# Patient Record
Sex: Male | Born: 1980 | Race: Black or African American | Marital: Single
Health system: Southern US, Community
[De-identification: ages and names within clinical notes are randomized; demographics above are authoritative.]

---

## 2010-06-15 ENCOUNTER — Other Ambulatory Visit: Payer: Self-pay | Admitting: Orthopaedic Surgery

## 2010-06-15 DIAGNOSIS — M25511 Pain in right shoulder: Secondary | ICD-10-CM

## 2010-06-15 DIAGNOSIS — R531 Weakness: Secondary | ICD-10-CM

## 2010-06-19 ENCOUNTER — Other Ambulatory Visit: Payer: Self-pay

## 2010-06-23 ENCOUNTER — Other Ambulatory Visit: Payer: Self-pay

## 2010-06-23 ENCOUNTER — Other Ambulatory Visit: Payer: Self-pay | Admitting: Orthopaedic Surgery

## 2010-06-23 ENCOUNTER — Ambulatory Visit
Admission: RE | Admit: 2010-06-23 | Discharge: 2010-06-23 | Disposition: A | Discharge status: Home / Self Care | Payer: Self-pay | Source: Ambulatory Visit | Attending: Orthopaedic Surgery | Admitting: Orthopaedic Surgery

## 2010-06-23 DIAGNOSIS — R531 Weakness: Secondary | ICD-10-CM

## 2010-06-23 DIAGNOSIS — M25511 Pain in right shoulder: Secondary | ICD-10-CM

## 2010-07-06 ENCOUNTER — Ambulatory Visit: Payer: Self-pay | Attending: Orthopaedic Surgery

## 2010-07-06 DIAGNOSIS — M6281 Muscle weakness (generalized): Secondary | ICD-10-CM | POA: Insufficient documentation

## 2010-07-06 DIAGNOSIS — M25519 Pain in unspecified shoulder: Secondary | ICD-10-CM | POA: Insufficient documentation

## 2010-07-06 DIAGNOSIS — IMO0001 Reserved for inherently not codable concepts without codable children: Secondary | ICD-10-CM | POA: Insufficient documentation

## 2010-07-06 DIAGNOSIS — M25619 Stiffness of unspecified shoulder, not elsewhere classified: Secondary | ICD-10-CM | POA: Insufficient documentation

## 2010-07-14 ENCOUNTER — Ambulatory Visit: Payer: Self-pay | Attending: Orthopaedic Surgery

## 2010-07-14 DIAGNOSIS — M6281 Muscle weakness (generalized): Secondary | ICD-10-CM | POA: Insufficient documentation

## 2010-07-14 DIAGNOSIS — M25519 Pain in unspecified shoulder: Secondary | ICD-10-CM | POA: Insufficient documentation

## 2010-07-14 DIAGNOSIS — IMO0001 Reserved for inherently not codable concepts without codable children: Secondary | ICD-10-CM | POA: Insufficient documentation

## 2010-07-14 DIAGNOSIS — M25619 Stiffness of unspecified shoulder, not elsewhere classified: Secondary | ICD-10-CM | POA: Insufficient documentation

## 2010-07-21 ENCOUNTER — Ambulatory Visit: Payer: Self-pay

## 2010-07-23 ENCOUNTER — Ambulatory Visit: Payer: Self-pay

## 2010-07-28 ENCOUNTER — Ambulatory Visit: Payer: Self-pay

## 2010-07-31 ENCOUNTER — Ambulatory Visit: Payer: Self-pay

## 2010-08-07 ENCOUNTER — Ambulatory Visit: Payer: Self-pay

## 2015-02-05 ENCOUNTER — Encounter (HOSPITAL_COMMUNITY): Payer: Self-pay | Admitting: *Deleted

## 2015-02-05 ENCOUNTER — Emergency Department (HOSPITAL_COMMUNITY)
Admission: EM | Admit: 2015-02-05 | Discharge: 2015-02-05 | Disposition: A | Discharge status: Home / Self Care | Payer: BLUE CROSS/BLUE SHIELD | Attending: Emergency Medicine | Admitting: Emergency Medicine

## 2015-02-05 ENCOUNTER — Emergency Department (HOSPITAL_COMMUNITY): Payer: BLUE CROSS/BLUE SHIELD

## 2015-02-05 DIAGNOSIS — R109 Unspecified abdominal pain: Secondary | ICD-10-CM | POA: Diagnosis present

## 2015-02-05 DIAGNOSIS — N200 Calculus of kidney: Secondary | ICD-10-CM | POA: Diagnosis not present

## 2015-02-05 LAB — URINE MICROSCOPIC-ADD ON

## 2015-02-05 LAB — URINALYSIS, ROUTINE W REFLEX MICROSCOPIC
Bilirubin Urine: NEGATIVE
GLUCOSE, UA: NEGATIVE mg/dL
KETONES UR: NEGATIVE mg/dL
Nitrite: NEGATIVE
PH: 6 (ref 5.0–8.0)
Protein, ur: NEGATIVE mg/dL
Specific Gravity, Urine: 1.024 (ref 1.005–1.030)
Urobilinogen, UA: 0.2 mg/dL (ref 0.0–1.0)

## 2015-02-05 LAB — CBC
HCT: 41.3 % (ref 39.0–52.0)
Hemoglobin: 13.9 g/dL (ref 13.0–17.0)
MCH: 28.4 pg (ref 26.0–34.0)
MCHC: 33.7 g/dL (ref 30.0–36.0)
MCV: 84.5 fL (ref 78.0–100.0)
PLATELETS: 194 10*3/uL (ref 150–400)
RBC: 4.89 MIL/uL (ref 4.22–5.81)
RDW: 13.8 % (ref 11.5–15.5)
WBC: 11.4 10*3/uL — ABNORMAL HIGH (ref 4.0–10.5)

## 2015-02-05 LAB — LIPASE, BLOOD: LIPASE: 24 U/L (ref 22–51)

## 2015-02-05 LAB — COMPREHENSIVE METABOLIC PANEL
ALBUMIN: 4.5 g/dL (ref 3.5–5.0)
ALK PHOS: 88 U/L (ref 38–126)
ALT: 24 U/L (ref 17–63)
AST: 36 U/L (ref 15–41)
Anion gap: 11 (ref 5–15)
BUN: 10 mg/dL (ref 6–20)
CALCIUM: 10 mg/dL (ref 8.9–10.3)
CHLORIDE: 104 mmol/L (ref 101–111)
CO2: 23 mmol/L (ref 22–32)
CREATININE: 1.3 mg/dL — AB (ref 0.61–1.24)
GFR calc Af Amer: >60 mL/min (ref >60)
GFR calc non Af Amer: >60 mL/min (ref >60)
GLUCOSE: 132 mg/dL — AB (ref 65–99)
Potassium: 3.8 mmol/L (ref 3.5–5.1)
SODIUM: 138 mmol/L (ref 135–145)
Total Bilirubin: 2.7 mg/dL — ABNORMAL HIGH (ref 0.3–1.2)
Total Protein: 8.4 g/dL — ABNORMAL HIGH (ref 6.5–8.1)

## 2015-02-05 MED ORDER — TAMSULOSIN HCL 0.4 MG PO CAPS: 0.4000 mg | ORAL_CAPSULE | Freq: Every day | ORAL | Status: AC

## 2015-02-05 MED ORDER — IOHEXOL 300 MG/ML  SOLN
25.0000 mL | Freq: Once | INTRAMUSCULAR | Status: AC | PRN
  Administered 2015-02-05: 25 mL via ORAL

## 2015-02-05 MED ORDER — IOHEXOL 300 MG/ML  SOLN
100.0000 mL | Freq: Once | INTRAMUSCULAR | Status: AC | PRN
  Administered 2015-02-05: 100 mL via INTRAVENOUS

## 2015-02-05 MED ORDER — ONDANSETRON HCL 4 MG/2ML IJ SOLN
4.0000 mg | Freq: Once | INTRAMUSCULAR | Status: AC
  Administered 2015-02-05: 4 mg via INTRAVENOUS
  Filled 2015-02-05: qty 2

## 2015-02-05 MED ORDER — FENTANYL CITRATE (PF) 100 MCG/2ML IJ SOLN
50.0000 ug | Freq: Once | INTRAMUSCULAR | Status: AC
  Administered 2015-02-05: 50 ug via INTRAVENOUS
  Filled 2015-02-05: qty 2

## 2015-02-05 MED ORDER — SODIUM CHLORIDE 0.9 % IV BOLUS (SEPSIS)
1000.0000 mL | Freq: Once | INTRAVENOUS | Status: AC
  Administered 2015-02-05: 1000 mL via INTRAVENOUS

## 2015-02-05 MED ORDER — IBUPROFEN 600 MG PO TABS: 600.0000 mg | ORAL_TABLET | Freq: Three times a day (TID) | ORAL | Status: AC

## 2015-02-05 MED ORDER — KETOROLAC TROMETHAMINE 30 MG/ML IJ SOLN
30.0000 mg | Freq: Once | INTRAMUSCULAR | Status: AC
  Administered 2015-02-05: 30 mg via INTRAVENOUS
  Filled 2015-02-05: qty 1

## 2015-02-05 MED ORDER — HYDROCODONE-ACETAMINOPHEN 5-325 MG PO TABS: 1.0000 | ORAL_TABLET | ORAL | Status: AC | PRN

## 2015-02-05 NOTE — ED Notes (Signed)
Pt noted on cell phone, laughing and in good spirits. Family at bedside. Pt given urinal and informed that UA was needed.

## 2015-02-05 NOTE — Discharge Instructions (Signed)
Dietary Guidelines to Help Prevent Kidney Stones °Your risk of kidney stones can be decreased by adjusting the foods you eat. The most important thing you can do is drink enough fluid. You should drink enough fluid to keep your urine clear or pale yellow. The following guidelines provide specific information for the type of kidney stone you have had. °GUIDELINES ACCORDING TO TYPE OF KIDNEY STONE °Calcium Oxalate Kidney Stones °· Reduce the amount of salt you eat. Foods that have a lot of salt cause your body to release excess calcium into your urine. The excess calcium can combine with a substance called oxalate to form kidney stones. °· Reduce the amount of animal protein you eat if the amount you eat is excessive. Animal protein causes your body to release excess calcium into your urine. Ask your dietitian how much protein from animal sources you should be eating. °· Avoid foods that are high in oxalates. If you take vitamins, they should have less than 500 mg of vitamin C. Your body turns vitamin C into oxalates. You do not need to avoid fruits and vegetables high in vitamin C. °Calcium Phosphate Kidney Stones °· Reduce the amount of salt you eat to help prevent the release of excess calcium into your urine. °· Reduce the amount of animal protein you eat if the amount you eat is excessive. Animal protein causes your body to release excess calcium into your urine. Ask your dietitian how much protein from animal sources you should be eating. °· Get enough calcium from food or take a calcium supplement (ask your dietitian for recommendations). Food sources of calcium that do not increase your risk of kidney stones include: °¨ Broccoli. °¨ Dairy products, such as cheese and yogurt. °¨ Pudding. °Uric Acid Kidney Stones °· Do not have more than 6 oz of animal protein per day. °FOOD SOURCES °Animal Protein Sources °· Meat (all types). °· Poultry. °· Eggs. °· Fish, seafood. °Foods High in Salt °· Salt seasonings. °· Soy  sauce. °· Teriyaki sauce. °· Cured and processed meats. °· Salted crackers and snack foods. °· Fast food. °· Canned soups and most canned foods. °Foods High in Oxalates °· Grains: °¨ Amaranth. °¨ Barley. °¨ Grits. °¨ Wheat germ. °¨ Bran. °¨ Buckwheat flour. °¨ All bran cereals. °¨ Pretzels. °¨ Whole wheat bread. °· Vegetables: °¨ Beans (wax). °¨ Beets and beet greens. °¨ Collard greens. °¨ Eggplant. °¨ Escarole. °¨ Leeks. °¨ Okra. °¨ Parsley. °¨ Rutabagas. °¨ Spinach. °¨ Swiss chard. °¨ Tomato paste. °¨ Fried potatoes. °¨ Sweet potatoes. °· Fruits: °¨ Red currants. °¨ Figs. °¨ Kiwi. °¨ Rhubarb. °· Meat and Other Protein Sources: °¨ Beans (dried). °¨ Soy burgers and other soybean products. °¨ Miso. °¨ Nuts (peanuts, almonds, pecans, cashews, hazelnuts). °¨ Nut butters. °¨ Sesame seeds and tahini (paste made of sesame seeds). °¨ Poppy seeds. °· Beverages: °¨ Chocolate drink mixes. °¨ Soy milk. °¨ Instant iced tea. °¨ Juices made from high-oxalate fruits or vegetables. °· Other: °¨ Carob. °¨ Chocolate. °¨ Fruitcake. °¨ Marmalades. °Document Released: 08/21/2010 Document Revised: 05/01/2013 Document Reviewed: 03/23/2013 °ExitCare® Patient Information ©2015 [employer]. This information is not intended to replace advice given to you by your health care provider. Make sure you discuss any questions you have with your health care provider. ° °Kidney Stones °Kidney stones (urolithiasis) are solid masses that form inside your kidneys. The intense pain is caused by the stone moving through the kidney, ureter, bladder, and urethra (urinary tract). When the stone moves, the   ureter starts to spasm around the stone. The stone is usually passed in your pee (urine).  °HOME CARE °· Drink enough fluids to keep your pee clear or pale yellow. This helps to get the stone out. °· Strain all pee through the provided strainer. Do not pee without peeing through the strainer, not even once. If you pee the stone out, catch it in the  strainer. The stone may be as small as a grain of salt. Take this to your doctor. This will help your doctor figure out what you can do to try to prevent more kidney stones. °· Only take medicine as told by your doctor. °· Follow up with your doctor as told. °· Get follow-up X-rays as told by your doctor. °GET HELP IF: °You have pain that gets worse even if you have been taking pain medicine. °GET HELP RIGHT AWAY IF:  °· Your pain does not get better with medicine. °· You have a fever or shaking chills. °· Your pain increases and gets worse over 18 hours. °· You have new belly (abdominal) pain. °· You feel faint or pass out. °· You are unable to pee. °MAKE SURE YOU:  °· Understand these instructions. °· Will watch your condition. °· Will get help right away if you are not doing well or get worse. °Document Released: 10/13/2007 Document Revised: 12/27/2012 Document Reviewed: 09/27/2012 °ExitCare® Patient Information ©2015 [employer]. This information is not intended to replace advice given to you by your health care provider. Make sure you discuss any questions you have with your health care provider. ° °

## 2015-02-05 NOTE — ED Notes (Addendum)
Pt reports recent stomach virus x 2 days with n/v/d, now having severe left flank pain. Denies urinary symptoms. Appears in severe pain at triage.

## 2015-02-05 NOTE — ED Notes (Signed)
Pt returned from CT placed back on the monitor.

## 2015-02-05 NOTE — ED Notes (Addendum)
Noted pt had to pick up legs to move them while trying to get from wheelchair to bed. Two staff assist to get pt into bed. Pt placed on continuous BP and pulse ox as well as cardiac monitoring.  Pt placed in yellow fall risk socks and yellow fall risk bracelet.

## 2015-02-05 NOTE — ED Provider Notes (Signed)
CSN: [id number]     Arrival date & time 02/05/15  1023 History   First MD Initiated Contact with Patient 02/05/15 1116     Chief Complaint  Patient presents with  . Flank Pain     (Consider location/radiation/quality/duration/timing/severity/associated sxs/prior Treatment) HPI  Patient presents with 1+ days of nausea, vomiting, diarrhea, left lower back, left lower abdominal, and left leg pain. Patient was well prior to the onset of symptoms, about 28 hours ago. Since onset symptoms of been persistent, with no oral intake, persistent nausea, vomiting. Patient developed the left lower back, left lower abdominal pain gradually. He now completes of pain in the left lower back, abdomen with left leg movement. Pain is sore, severe, sharp He can move the leg, has been ambulatory. Patient has been intolerant of oral intake, has taken no medication for symptom relief. No new fever, confusion, chest pain, dyspnea. No sick contacts.   History reviewed. No pertinent past medical history. History reviewed. No pertinent past surgical history. History reviewed. No pertinent family history. Social History  Substance Use Topics  . Smoking status: Unknown If Ever Smoked  . Smokeless tobacco: None  . Alcohol Use: No    Review of Systems  Constitutional:       Per HPI, otherwise negative  HENT:       Per HPI, otherwise negative  Respiratory:       Per HPI, otherwise negative  Cardiovascular:       Per HPI, otherwise negative  Gastrointestinal: Positive for nausea and vomiting.  Endocrine:       Negative aside from HPI  Genitourinary:       Neg aside from HPI   Musculoskeletal:       Per HPI, otherwise negative  Skin: Negative.   Neurological: Negative for syncope.      Allergies  Review of patient's allergies indicates no known allergies.  Home Medications   Prior to Admission medications   Medication Sig Start Date End Date Taking? Authorizing Provider   HYDROcodone-acetaminophen (NORCO/VICODIN) 5-325 MG tablet Take 1 tablet by mouth every 4 (four) hours as needed for severe pain. 02/05/15   Gerhard Munch, MD  ibuprofen (ADVIL,MOTRIN) 600 MG tablet Take 1 tablet (600 mg total) by mouth 3 (three) times daily. 02/05/15 02/08/15  Gerhard Munch, MD  tamsulosin (FLOMAX) 0.4 MG CAPS capsule Take 1 capsule (0.4 mg total) by mouth daily. 02/05/15   Gerhard Munch, MD   BP 134/64 mmHg  Pulse 61  Temp(Src) 97.5 F (36.4 C) (Oral)  Resp 16  Ht 6' (1.829 m)  Wt 200 lb (90.719 kg)  BMI 27.12 kg/m2  SpO2 100% Physical Exam  Constitutional: He is oriented to person, place, and time. He appears well-developed. No distress.  HENT:  Head: Normocephalic and atraumatic.  Eyes: Conjunctivae and EOM are normal.  Cardiovascular: Normal rate and regular rhythm.   Pulmonary/Chest: Effort normal. No stridor. No respiratory distress.  Abdominal: He exhibits no distension. There is tenderness in the left lower quadrant. There is guarding.  Pain in the left low back, no deformity  Musculoskeletal: He exhibits no edema.  Negative straight leg test, with passive hip flexion on the left, right.  Neurological: He is alert and oriented to person, place, and time. He displays no atrophy and no tremor. He exhibits normal muscle tone. He displays no seizure activity. Coordination normal.  Skin: Skin is warm and dry.  Psychiatric: He has a normal mood and affect.  Nursing note and vitals reviewed.  ED Course  Procedures (including critical care time) Labs Review Labs Reviewed  COMPREHENSIVE METABOLIC PANEL - Abnormal; Notable for the following:    Glucose, Bld 132 (*)    Creatinine, Ser 1.30 (*)    Total Protein 8.4 (*)    Total Bilirubin 2.7 (*)    All other components within normal limits  CBC - Abnormal; Notable for the following:    WBC 11.4 (*)    All other components within normal limits  URINALYSIS, ROUTINE W REFLEX MICROSCOPIC (NOT AT [facility]) -  Abnormal; Notable for the following:    APPearance CLOUDY (*)    Hgb urine dipstick LARGE (*)    Leukocytes, UA TRACE (*)    All other components within normal limits  URINE MICROSCOPIC-ADD ON - Abnormal; Notable for the following:    Casts HYALINE CASTS (*)    All other components within normal limits  LIPASE, BLOOD    Imaging Review Ct Abdomen Pelvis W Contrast  02/05/2015   CLINICAL DATA:  LEFT-sided back pain, nausea and vomiting, and loss of appetite for 1.5 days.  EXAM: CT ABDOMEN AND PELVIS WITH CONTRAST  TECHNIQUE: Multidetector CT imaging of the abdomen and pelvis was performed using the standard protocol following bolus administration of intravenous contrast.  CONTRAST:  OMNIPAQUE IOHEXOL 300 MG/ML  SOLN  COMPARISON:  None.  FINDINGS: BODY WALL: Unremarkable.  LOWER CHEST: Unremarkable.  ABDOMEN/PELVIS:  Liver: No focal abnormality.  Biliary: No evidence of biliary obstruction or stone.  Pancreas: Unremarkable.  Spleen: Unremarkable.  Adrenals: Unremarkable.  Kidneys and ureters: LEFT hydronephrosis and hydroureter secondary to a 3 mm mid ureteral calculus. There are also tiny nonobstructing intrarenal calculi on the RIGHT and LEFT.  Bladder: Unremarkable.  Reproductive: Unremarkable.  Bowel: No obstruction. No appendiceal inflammation.  Retroperitoneum: No mass or adenopathy.  Peritoneum: No free fluid or gas.  Vascular: No acute abnormality.  OSSEOUS: No acute abnormalities.  IMPRESSION: LEFT hydronephrosis and hydroureter. 3 mm LEFT mid ureteral calculus.  Nephrolithiasis.  Otherwise unremarkable CT abdomen and pelvis.   Electronically Signed   By: Elsie Stain M.D.   On: 02/05/2015 13:53   I have personally reviewed and evaluated these images and lab results as part of my medical decision-making.  3:42 PM Patient substantially better on repeat exam, moving all extremity spontaneously. He, and I had a lengthy conversation about kidney stones, all findings today. Patient will  follow-up with urology.  MDM   Final diagnoses:  Kidney stone on left side   patient presents with new abdominal pain, back pain, left lower extremity pain, and weakness that is most likely secondary to pain, as this improves after analgesia is provided. Patient's evaluation suggests kidney stone as causative etiology, with mild elevation in creatinine, mild leukocytosis, but no evidence for infection, bacteremia, sepsis. Patient's pain was addressed, diminished substantially, and the patient was discharged in stable condition.  Gerhard Munch, MD 02/05/15 [phone]

## 2016-06-05 ENCOUNTER — Emergency Department (HOSPITAL_COMMUNITY): Payer: BLUE CROSS/BLUE SHIELD

## 2016-06-05 ENCOUNTER — Encounter (HOSPITAL_COMMUNITY): Payer: Self-pay

## 2016-06-05 ENCOUNTER — Emergency Department (HOSPITAL_COMMUNITY)
Admission: EM | Admit: 2016-06-05 | Discharge: 2016-06-05 | Disposition: A | Discharge status: Home / Self Care | Payer: BLUE CROSS/BLUE SHIELD | Attending: Emergency Medicine | Admitting: Emergency Medicine

## 2016-06-05 DIAGNOSIS — S62339A Displaced fracture of neck of unspecified metacarpal bone, initial encounter for closed fracture: Secondary | ICD-10-CM

## 2016-06-05 DIAGNOSIS — Y939 Activity, unspecified: Secondary | ICD-10-CM | POA: Insufficient documentation

## 2016-06-05 DIAGNOSIS — S6991XA Unspecified injury of right wrist, hand and finger(s), initial encounter: Secondary | ICD-10-CM | POA: Diagnosis present

## 2016-06-05 DIAGNOSIS — S62316A Displaced fracture of base of fifth metacarpal bone, right hand, initial encounter for closed fracture: Secondary | ICD-10-CM | POA: Insufficient documentation

## 2016-06-05 DIAGNOSIS — Y929 Unspecified place or not applicable: Secondary | ICD-10-CM | POA: Diagnosis not present

## 2016-06-05 DIAGNOSIS — S62616A Displaced fracture of proximal phalanx of right little finger, initial encounter for closed fracture: Secondary | ICD-10-CM | POA: Diagnosis not present

## 2016-06-05 DIAGNOSIS — Y999 Unspecified external cause status: Secondary | ICD-10-CM | POA: Diagnosis not present

## 2016-06-05 NOTE — ED Notes (Signed)
Pt got back from Xray.

## 2016-06-05 NOTE — ED Notes (Signed)
Declined W/C at D/C and was escorted to lobby by RN. 
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
## 2016-06-05 NOTE — ED Triage Notes (Signed)
DR Amanda PeaGramig call to update PT. On his plan of care. Pt Gramig delayed for 1 hr. Pt informed of delay and Pt offered a meal  But Pt refused . Pt also refused any drinks. P\t voiced understanding of delay.
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
## 2016-06-05 NOTE — Progress Notes (Signed)
Orthopedic Tech Progress Note Patient Details:  Tyler Newman June 09, 1980 [id number]  Ortho Devices Type of Ortho Device: Ace wrap, Post (short arm) splint Ortho Device/Splint Interventions: Application   Saul FordyceJennifer C Lavon Horn 06/05/2016, 12:40 PM
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
